# Patient Record
Sex: Male | Born: 1985 | Race: White | Hispanic: No | Marital: Single | Smoking: Current every day smoker
Health system: Southern US, Community
[De-identification: ages and names within clinical notes are randomized; demographics above are authoritative.]

## PROBLEM LIST (undated history)

## (undated) DIAGNOSIS — M1990 Unspecified osteoarthritis, unspecified site: Secondary | ICD-10-CM

---

## 2006-08-11 ENCOUNTER — Emergency Department: Payer: Self-pay | Admitting: Emergency Medicine

## 2008-12-21 ENCOUNTER — Ambulatory Visit: Payer: Self-pay | Admitting: Internal Medicine

## 2010-06-23 ENCOUNTER — Emergency Department (HOSPITAL_COMMUNITY)
Admission: EM | Admit: 2010-06-23 | Discharge: 2010-06-23 | Payer: Self-pay | Source: Home / Self Care | Admitting: Emergency Medicine

## 2016-12-07 ENCOUNTER — Emergency Department
Admission: EM | Admit: 2016-12-07 | Discharge: 2016-12-07 | Disposition: A | Discharge status: Skilled Nursing Facility | Payer: Self-pay | Attending: Emergency Medicine | Admitting: Emergency Medicine

## 2016-12-07 DIAGNOSIS — G4700 Insomnia, unspecified: Secondary | ICD-10-CM | POA: Insufficient documentation

## 2016-12-07 DIAGNOSIS — E876 Hypokalemia: Secondary | ICD-10-CM | POA: Insufficient documentation

## 2016-12-07 DIAGNOSIS — F1099 Alcohol use, unspecified with unspecified alcohol-induced disorder: Secondary | ICD-10-CM | POA: Insufficient documentation

## 2016-12-07 DIAGNOSIS — F17200 Nicotine dependence, unspecified, uncomplicated: Secondary | ICD-10-CM | POA: Insufficient documentation

## 2016-12-07 DIAGNOSIS — R531 Weakness: Secondary | ICD-10-CM | POA: Insufficient documentation

## 2016-12-07 HISTORY — DX: Unspecified osteoarthritis, unspecified site: M19.90

## 2016-12-07 LAB — BASIC METABOLIC PANEL
ANION GAP: 14 (ref 5–15)
ANION GAP: 10 (ref 5–15)
BUN: 13 mg/dL (ref 6–20)
BUN: 12 mg/dL (ref 6–20)
CHLORIDE: 99 mmol/L — AB (ref 101–111)
CO2: 27 mmol/L (ref 22–32)
CO2: 28 mmol/L (ref 22–32)
Calcium: 9.3 mg/dL (ref 8.9–10.3)
Calcium: 8.8 mg/dL — ABNORMAL LOW (ref 8.9–10.3)
Chloride: 96 mmol/L — ABNORMAL LOW (ref 101–111)
Creatinine, Ser: 0.7 mg/dL (ref 0.61–1.24)
Creatinine, Ser: 0.76 mg/dL (ref 0.61–1.24)
GFR calc Af Amer: >60 mL/min (ref >60)
GFR calc non Af Amer: >60 mL/min (ref >60)
GLUCOSE: 109 mg/dL — AB (ref 65–99)
Glucose, Bld: 106 mg/dL — ABNORMAL HIGH (ref 65–99)
POTASSIUM: 2.2 mmol/L — AB (ref 3.5–5.1)
POTASSIUM: 2.9 mmol/L — AB (ref 3.5–5.1)
SODIUM: 137 mmol/L (ref 135–145)
SODIUM: 137 mmol/L (ref 135–145)

## 2016-12-07 LAB — POTASSIUM: Potassium: 2.5 mmol/L — CL (ref 3.5–5.1)

## 2016-12-07 LAB — CBC
HEMATOCRIT: 49.5 % (ref 40.0–52.0)
HEMOGLOBIN: 17.5 g/dL (ref 13.0–18.0)
MCH: 36.2 pg — ABNORMAL HIGH (ref 26.0–34.0)
MCHC: 35.3 g/dL (ref 32.0–36.0)
MCV: 102.5 fL — ABNORMAL HIGH (ref 80.0–100.0)
Platelets: 251 10*3/uL (ref 150–440)
RBC: 4.83 MIL/uL (ref 4.40–5.90)
RDW: 13.4 % (ref 11.5–14.5)
WBC: 7.5 10*3/uL (ref 3.8–10.6)

## 2016-12-07 LAB — MAGNESIUM: MAGNESIUM: 1.5 mg/dL — AB (ref 1.7–2.4)

## 2016-12-07 MED ORDER — NICOTINE 14 MG/24HR TD PT24
14.0000 mg | MEDICATED_PATCH | Freq: Once | TRANSDERMAL | Status: DC
  Administered 2016-12-07: 14 mg via TRANSDERMAL
  Filled 2016-12-07: qty 1

## 2016-12-07 MED ORDER — MAGNESIUM SULFATE IN D5W 1-5 GM/100ML-% IV SOLN
1.0000 g | Freq: Once | INTRAVENOUS | Status: AC
  Administered 2016-12-07: 1 g via INTRAVENOUS
  Filled 2016-12-07: qty 100

## 2016-12-07 MED ORDER — POTASSIUM CHLORIDE IN NACL 20-0.9 MEQ/L-% IV SOLN
Freq: Once | INTRAVENOUS | Status: AC
  Administered 2016-12-07: 13:00:00 via INTRAVENOUS
  Filled 2016-12-07: qty 1000

## 2016-12-07 MED ORDER — POTASSIUM CHLORIDE ER 10 MEQ PO TBCR: 20.0000 meq | EXTENDED_RELEASE_TABLET | Freq: Every day | ORAL | 0 refills | Status: AC

## 2016-12-07 MED ORDER — ZOLPIDEM TARTRATE 10 MG PO TABS: 10.0000 mg | ORAL_TABLET | Freq: Every evening | ORAL | 0 refills | Status: AC | PRN

## 2016-12-07 MED ORDER — POTASSIUM CHLORIDE CRYS ER 20 MEQ PO TBCR
60.0000 meq | EXTENDED_RELEASE_TABLET | Freq: Once | ORAL | Status: AC
  Administered 2016-12-07: 60 meq via ORAL
  Filled 2016-12-07: qty 3

## 2016-12-07 NOTE — ED Notes (Signed)
First Nurse: pt presents with shortness of breath.

## 2016-12-07 NOTE — ED Provider Notes (Signed)
-----------------------------------------   6:49 PM on 12/07/2016 -----------------------------------------  Patient's potassium has. Patient has been able to eat here. Patient still would rather not be admitted to the hospital. He was given some magnesium through IV. Patient states he feels much improved.At this point will plan on discharging with prescriptions prepared by Dr. Mayford KnifeWilliams. Will give patient information on potassium content of foods. Will give patient primary care follow-up.   Phineas SemenGoodman, Symantha Steeber, MD 12/07/16 [phone]

## 2016-12-07 NOTE — ED Notes (Signed)
ED Provider at bedside. 

## 2016-12-07 NOTE — ED Provider Notes (Addendum)
[facility] Emergency Department Provider Note       Time seen: ----------------------------------------- 1:29 PM on 12/07/2016 -----------------------------------------     I have reviewed the triage vital signs and the nursing notes.   HISTORY   Chief Complaint Weakness    HPI Kyle Mora is a 31 y.o. male who presents to the ED for weakness for the past several days and vomiting that began this morning. Patient states "I can barely lift my arm". He has had some sweatiness, he denies fevers, chills, chest pain, shortness of breath, diarrhea or fever. He did have an episode of vomiting but there was no hematemesis. Patient states he has been drinking alcohol to try to help him sleep. He drinks about 12 ounces of alcohol every night. He reports he is gone almost 2 weeks without significant sleep.   Past Medical History:  Diagnosis Date  . Arthritis     There are no active problems to display for this patient.   History reviewed. No pertinent surgical history.  Allergies Patient has no known allergies.  Social History Social History  Substance Use Topics  . Smoking status: Current Every Day Smoker  . Smokeless tobacco: Not on file  . Alcohol use Yes    Review of Systems Constitutional: Negative for fever. Eyes: Negative for vision changes ENT:  Negative for congestion, sore throat Cardiovascular: Negative for chest pain. Respiratory: Negative for shortness of breath. Gastrointestinal: Negative for abdominal pain,Positive for vomiting Genitourinary: Negative for dysuria. Musculoskeletal: Negative for back pain. Skin: Negative for rash. Neurological: Negative for headaches,Positive for generalized weakness  All systems negative/normal/unremarkable except as stated in the HPI  ____________________________________________   PHYSICAL EXAM:  VITAL SIGNS: ED Triage Vitals  Enc Vitals Group     BP 12/07/16 1233 130/80   Pulse Rate 12/07/16 1233 100     Resp 12/07/16 1233 18     Temp 12/07/16 1233 98.2 F (36.8 C)     Temp Source 12/07/16 1233 Oral     SpO2 12/07/16 1233 99 %     Weight 12/07/16 1233 170 lb (77.1 kg)     Height 12/07/16 1233 5\' 8"  (1.727 m)     Head Circumference --      Peak Flow --      Pain Score 12/07/16 1235 0     Pain Loc --      Pain Edu? --      Excl. in GC? --     Constitutional: Alert and oriented. Well appearing and in no distress. Eyes: Conjunctivae are normal. Normal extraocular movements. ENT   Head: Normocephalic and atraumatic.   Nose: No congestion/rhinnorhea.   Mouth/Throat: Mucous membranes are moist.   Neck: No stridor. Cardiovascular: Normal rate, regular rhythm. No murmurs, rubs, or gallops. Respiratory: Normal respiratory effort without tachypnea nor retractions. Breath sounds are clear and equal bilaterally. No wheezes/rales/rhonchi. Gastrointestinal: Soft and nontender. Normal bowel sounds Musculoskeletal: Nontender with normal range of motion in extremities. No lower extremity tenderness nor edema. Neurologic:  Normal speech and language. No gross focal neurologic deficits are appreciated.  Skin:  Skin is warm, dry and intact. No rash noted. Psychiatric: Mood and affect are normal. Speech and behavior are normal.  ____________________________________________  EKG: Interpreted by me. Sinus tachycardia with rate 104 bpm, normal PR interval, normal QRS, normal QT, inferior ST segment abnormalities with possible U wave  ____________________________________________  ED COURSE:  Pertinent labs & imaging results that were available during my care of the  patient were reviewed by me and considered in my medical decision making (see chart for details). Patient presents for weakness, we will assess with labs as indicated.   Procedures ____________________________________________   LABS (pertinent positives/negatives)  Labs Reviewed  BASIC  METABOLIC PANEL - Abnormal; Notable for the following:       Result Value   Potassium 2.2 (*)    Chloride 96 (*)    Glucose, Bld 109 (*)    All other components within normal limits  CBC - Abnormal; Notable for the following:    MCV 102.5 (*)    MCH 36.2 (*)    All other components within normal limits  URINALYSIS, COMPLETE (UACMP) WITH MICROSCOPIC  POTASSIUM  CBG MONITORING, ED   ____________________________________________  FINAL ASSESSMENT AND PLAN  Weakness, hypokalemia, insomnia, alcohol use disorder  Plan: Patient's labs and imaging were dictated above. Patient had presented for weakness and was found to have significantly low potassium with a level of 2.2. He was given oral and IV potassium.   Emily Filbert, MD   Note: This note was generated in part or whole with voice recognition software. Voice recognition is usually quite accurate but there are transcription errors that can and very often do occur. I apologize for any typographical errors that were not detected and corrected.     Emily Filbert, MD 12/07/16 1331    Emily Filbert, MD 12/07/16 [phone]

## 2016-12-07 NOTE — ED Notes (Addendum)
Pt c/o weakness xfew days. Pt states increased stress and decreased sleep, hx of insomnia. Pt has notable tremors, A&OX4.  Pt states CP earlier today but denies any at this time. Pt admits to drinking daily, denies any ETOH today.

## 2016-12-07 NOTE — ED Provider Notes (Signed)
Repeat potassium is 2.5. Patient would prefer to stay out of the hospital. We will give oral and IV potassium and recheck.   Emily FilbertWilliams, Jonathan E, MD 12/07/16 [phone]

## 2016-12-07 NOTE — ED Notes (Signed)
Date and time results received: 12/07/16 1:45 PM   Test: Potassium Critical Value: 2.5  Name of Provider Notified: Dr. Mayford KnifeWilliams  Orders Received? Or Actions Taken?: Actions Taken: Dr. Mayford KnifeWilliams notified and acknowledged

## 2016-12-07 NOTE — ED Triage Notes (Signed)
Lab notifies K 2.2, patient roomed.

## 2016-12-07 NOTE — ED Triage Notes (Signed)
Pt presents to ED for weakness and vomiting that began this morning. "I can barely lift my arm." pt states sweatiness. Denies CP, SOB, diarrhea, fevers. States 1 episode of emesis, denies blood in it.

## 2016-12-07 NOTE — ED Notes (Signed)
Pt stated he felt like he was going to pass out after blood draw. Informed slow deep breaths. Pt asked to lay down. Pt moved to recliner and feet propped up.

## 2016-12-07 NOTE — Discharge Instructions (Signed)
Please seek medical attention for any high fevers, chest pain, shortness of breath, change in behavior, persistent vomiting, bloody stool or any other new or concerning symptoms.  

## 2017-03-04 ENCOUNTER — Encounter: Payer: Self-pay | Admitting: Emergency Medicine

## 2017-03-04 ENCOUNTER — Emergency Department: Payer: Self-pay

## 2017-03-04 ENCOUNTER — Emergency Department
Admission: EM | Admit: 2017-03-04 | Discharge: 2017-03-04 | Disposition: A | Discharge status: Home / Self Care | Payer: Self-pay | Attending: Student in an Organized Health Care Education/Training Program | Admitting: Student in an Organized Health Care Education/Training Program

## 2017-03-04 DIAGNOSIS — R51 Headache: Secondary | ICD-10-CM | POA: Insufficient documentation

## 2017-03-04 DIAGNOSIS — J189 Pneumonia, unspecified organism: Secondary | ICD-10-CM

## 2017-03-04 DIAGNOSIS — J181 Lobar pneumonia, unspecified organism: Secondary | ICD-10-CM | POA: Insufficient documentation

## 2017-03-04 DIAGNOSIS — R0981 Nasal congestion: Secondary | ICD-10-CM | POA: Insufficient documentation

## 2017-03-04 DIAGNOSIS — F17200 Nicotine dependence, unspecified, uncomplicated: Secondary | ICD-10-CM | POA: Insufficient documentation

## 2017-03-04 DIAGNOSIS — R509 Fever, unspecified: Secondary | ICD-10-CM | POA: Insufficient documentation

## 2017-03-04 DIAGNOSIS — M25551 Pain in right hip: Secondary | ICD-10-CM | POA: Insufficient documentation

## 2017-03-04 DIAGNOSIS — Z79899 Other long term (current) drug therapy: Secondary | ICD-10-CM | POA: Insufficient documentation

## 2017-03-04 LAB — URINALYSIS, COMPLETE (UACMP) WITH MICROSCOPIC
BILIRUBIN URINE: NEGATIVE
Bacteria, UA: NONE SEEN
Glucose, UA: NEGATIVE mg/dL
Ketones, ur: NEGATIVE mg/dL
NITRITE: NEGATIVE
PH: 6 (ref 5.0–8.0)
Protein, ur: 100 mg/dL — AB
SPECIFIC GRAVITY, URINE: 1.02 (ref 1.005–1.030)

## 2017-03-04 LAB — COMPREHENSIVE METABOLIC PANEL
ALBUMIN: 3.8 g/dL (ref 3.5–5.0)
ALK PHOS: 61 U/L (ref 38–126)
ALT: 29 U/L (ref 17–63)
ANION GAP: 16 — AB (ref 5–15)
AST: 39 U/L (ref 15–41)
BILIRUBIN TOTAL: 1.1 mg/dL (ref 0.3–1.2)
BUN: 10 mg/dL (ref 6–20)
CALCIUM: 8.5 mg/dL — AB (ref 8.9–10.3)
CO2: 28 mmol/L (ref 22–32)
Chloride: 89 mmol/L — ABNORMAL LOW (ref 101–111)
Creatinine, Ser: 0.96 mg/dL (ref 0.61–1.24)
GLUCOSE: 148 mg/dL — AB (ref 65–99)
POTASSIUM: 2.7 mmol/L — AB (ref 3.5–5.1)
Sodium: 133 mmol/L — ABNORMAL LOW (ref 135–145)
TOTAL PROTEIN: 8.4 g/dL — AB (ref 6.5–8.1)

## 2017-03-04 LAB — CBC
HEMATOCRIT: 44.7 % (ref 40.0–52.0)
Hemoglobin: 16.1 g/dL (ref 13.0–18.0)
MCH: 36.4 pg — AB (ref 26.0–34.0)
MCHC: 36.1 g/dL — AB (ref 32.0–36.0)
MCV: 100.9 fL — ABNORMAL HIGH (ref 80.0–100.0)
Platelets: 222 10*3/uL (ref 150–440)
RBC: 4.43 MIL/uL (ref 4.40–5.90)
RDW: 13.6 % (ref 11.5–14.5)
WBC: 12.3 10*3/uL — ABNORMAL HIGH (ref 3.8–10.6)

## 2017-03-04 MED ORDER — DOXYCYCLINE HYCLATE 100 MG PO TABS
100.0000 mg | ORAL_TABLET | Freq: Once | ORAL | Status: AC
  Administered 2017-03-04: 100 mg via ORAL
  Filled 2017-03-04: qty 1

## 2017-03-04 MED ORDER — DOXYCYCLINE HYCLATE 50 MG PO CAPS: 100.0000 mg | ORAL_CAPSULE | Freq: Two times a day (BID) | ORAL | 0 refills | Status: AC

## 2017-03-04 MED ORDER — NAPROXEN 500 MG PO TABS: 500.0000 mg | ORAL_TABLET | Freq: Two times a day (BID) | ORAL | 0 refills | Status: AC

## 2017-03-04 MED ORDER — IBUPROFEN 600 MG PO TABS
600.0000 mg | ORAL_TABLET | Freq: Once | ORAL | Status: AC
  Administered 2017-03-04: 600 mg via ORAL
  Filled 2017-03-04: qty 1

## 2017-03-04 MED ORDER — POTASSIUM CHLORIDE CRYS ER 20 MEQ PO TBCR
40.0000 meq | EXTENDED_RELEASE_TABLET | Freq: Once | ORAL | Status: AC
  Administered 2017-03-04: 40 meq via ORAL
  Filled 2017-03-04: qty 2

## 2017-03-04 MED ORDER — DOXYCYCLINE HYCLATE 100 MG PO TABS: 100.0000 mg | ORAL_TABLET | Freq: Two times a day (BID) | ORAL | 0 refills | Status: AC

## 2017-03-04 MED ORDER — SODIUM CHLORIDE 0.9 % IV BOLUS (SEPSIS)
1000.0000 mL | Freq: Once | INTRAVENOUS | Status: AC
  Administered 2017-03-04: 1000 mL via INTRAVENOUS

## 2017-03-04 NOTE — ED Triage Notes (Signed)
Pt to ed with c/o right hip pain, cough, congestion, fever, x 4 days. [facility] for [facility], last IBU  at 0530 today for hip pain.  Negative mono, negative strep, negative flu at [facility].

## 2017-03-04 NOTE — ED Provider Notes (Signed)
[facility] Emergency Department Provider Note    First MD Initiated Contact with Patient 03/04/17 1224     (approximate)  I have reviewed the triage vital signs and the nursing notes.   HISTORY  Chief Complaint Cough; Nasal Congestion; Fever; and Headache    HPI Kyle Mora is a 31 y.o. male presents with chief complaint of initially nasal congestion followed by productive cough and fevers for the past 4 days. Patient's also complaining of right hip pain. States he's been having issues with his right hip for years. States that is typically worse after working a long shift. Has not had any surgeries previously. Denies any back pain. No new numbness or tingling. No saddle anesthesia. No urinary incontinence. No dysuria. No recent sick contacts.   Past Medical History:  Diagnosis Date  . Arthritis    History reviewed. No pertinent family history. History reviewed. No pertinent surgical history. There are no active problems to display for this patient.     Prior to Admission medications   Medication Sig Start Date End Date Taking? Authorizing Provider  amitriptyline (ELAVIL) 25 MG tablet Take 50 mg by mouth at bedtime. 02/22/17  Yes [provider]  ibuprofen (ADVIL,MOTRIN) 200 MG tablet Take 200 mg by mouth every 6 (six) hours as needed.   Yes [provider]  doxycycline (VIBRAMYCIN) 50 MG capsule Take 2 capsules (100 mg total) by mouth 2 (two) times daily. 03/04/17 03/14/17  Willy Eddy, MD  naproxen (NAPROSYN) 500 MG tablet Take 1 tablet (500 mg total) by mouth 2 (two) times daily with a meal. 03/04/17 03/04/18  Willy Eddy, MD  potassium chloride (K-DUR) 10 MEQ tablet Take 2 tablets (20 mEq total) by mouth daily. Patient not taking: Reported on 03/04/2017 12/07/16   Emily Filbert, MD  zolpidem (AMBIEN) 10 MG tablet Take 1 tablet (10 mg total) by mouth at bedtime as needed for sleep. Patient not taking: Reported  on 03/04/2017 12/07/16   Emily Filbert, MD    Allergies Patient has no known allergies.    Social History Social History  Substance Use Topics  . Smoking status: Current Every Day Smoker  . Smokeless tobacco: Never Used  . Alcohol use Yes    Review of Systems Patient denies headaches, rhinorrhea, blurry vision, numbness, shortness of breath, chest pain, edema, cough, abdominal pain, nausea, vomiting, diarrhea, dysuria, fevers, rashes or hallucinations unless otherwise stated above in HPI. ____________________________________________   PHYSICAL EXAM:  VITAL SIGNS: Vitals:   03/04/17 1129  BP: (!) 141/88  Pulse: (!) 138  Resp: 18  Temp: 99.7 F (37.6 C)  SpO2: 97%    Constitutional: Alert and oriented. in no acute distress. Eyes: Conjunctivae are normal.  Head: Atraumatic. Nose: No congestion/rhinnorhea. Mouth/Throat: Mucous membranes are moist.   Neck: No stridor. Painless ROM.  Cardiovascular: Normal rate, regular rhythm. Grossly normal heart sounds.  Good peripheral circulation. Respiratory: Normal respiratory effort.  No retractions. Lungs CTAB. Gastrointestinal: Soft and nontender. No distention. No abdominal bruits. No CVA tenderness.  No RLQ ttp Musculoskeletal: No lower extremity tenderness nor edema.  No joint effusions.  No pain with full active and passive flexion and extension of right hip.  No pain with deep palpation or log roll of right hip.  No pain elicited with internal or external rotation of the hip  No erythema or pulsatile mass. Neurologic:  Normal speech and language. No gross focal neurologic deficits are appreciated. No facial droop Skin:  Skin  is warm, dry and intact. No rash noted. Psychiatric: Mood and affect are normal. Speech and behavior are normal.  ____________________________________________   LABS (all labs ordered are listed, but only abnormal results are displayed)  Results for orders placed or performed during the hospital  encounter of 03/04/17 (from the past 24 hour(s))  CBC     Status: Abnormal   Collection Time: 03/04/17 11:36 AM  Result Value Ref Range   WBC 12.3 (H) 3.8 - 10.6 K/uL   RBC 4.43 4.40 - 5.90 MIL/uL   Hemoglobin 16.1 13.0 - 18.0 g/dL   HCT 16.1 09.6 - 04.5 %   MCV 100.9 (H) 80.0 - 100.0 fL   MCH 36.4 (H) 26.0 - 34.0 pg   MCHC 36.1 (H) 32.0 - 36.0 g/dL   RDW 40.9 81.1 - 91.4 %   Platelets 222 150 - 440 K/uL  Comprehensive metabolic panel     Status: Abnormal   Collection Time: 03/04/17 11:36 AM  Result Value Ref Range   Sodium 133 (L) 135 - 145 mmol/L   Potassium 2.7 (LL) 3.5 - 5.1 mmol/L   Chloride 89 (L) 101 - 111 mmol/L   CO2 28 22 - 32 mmol/L   Glucose, Bld 148 (H) 65 - 99 mg/dL   BUN 10 6 - 20 mg/dL   Creatinine, Ser 7.82 0.61 - 1.24 mg/dL   Calcium 8.5 (L) 8.9 - 10.3 mg/dL   Total Protein 8.4 (H) 6.5 - 8.1 g/dL   Albumin 3.8 3.5 - 5.0 g/dL   AST 39 15 - 41 U/L   ALT 29 17 - 63 U/L   Alkaline Phosphatase 61 38 - 126 U/L   Total Bilirubin 1.1 0.3 - 1.2 mg/dL   GFR calc non Af Amer >60 >60 mL/min   GFR calc Af Amer >60 >60 mL/min   Anion gap 16 (H) 5 - 15  Urinalysis, Complete w Microscopic     Status: Abnormal   Collection Time: 03/04/17  2:59 PM  Result Value Ref Range   Color, Urine AMBER (A) YELLOW   APPearance HAZY (A) CLEAR   Specific Gravity, Urine 1.020 1.005 - 1.030   pH 6.0 5.0 - 8.0   Glucose, UA NEGATIVE NEGATIVE mg/dL   Hgb urine dipstick SMALL (A) NEGATIVE   Bilirubin Urine NEGATIVE NEGATIVE   Ketones, ur NEGATIVE NEGATIVE mg/dL   Protein, ur 956 (A) NEGATIVE mg/dL   Nitrite NEGATIVE NEGATIVE   Leukocytes, UA TRACE (A) NEGATIVE   RBC / HPF 0-5 0 - 5 RBC/hpf   WBC, UA 6-30 0 - 5 WBC/hpf   Bacteria, UA NONE SEEN NONE SEEN   Squamous Epithelial / LPF 0-5 (A) NONE SEEN   Mucus PRESENT    Non Squamous Epithelial 0-5 (A) NONE SEEN   ____________________________________________ ____________________________________________  RADIOLOGY  I personally  reviewed all radiographic images ordered to evaluate for the above acute complaints and reviewed radiology reports and findings.  These findings were personally discussed with the patient.  Please see medical record for radiology report.  ____________________________________________   PROCEDURES  Procedure(s) performed:  Procedures    Critical Care performed: no ____________________________________________   INITIAL IMPRESSION / ASSESSMENT AND PLAN / ED COURSE  Pertinent labs & imaging results that were available during my care of the patient were reviewed by me and considered in my medical decision making (see chart for details).  DDX: flu, pna, uri, arthritis, fracture, septic arthritis, abscess  REIS GOGA is a 31 y.o. who presents to  the ED with symptoms as described above. Patient is with low-grade temperature here tachycardic but with no respiratory distress.  physical exam is soft and benign. This is not clinically consistent with appendicitis. There is no objective pain or reproducible discomfort on examination of the right hip or lower extremity. This is not clinically consistent with septic arthritis. Chest x-ray does show evidence of infiltrate in the right lower lobe and given his productive cough fever and tachycardia will treat him for community-acquired pneumonia. He is otherwise very well-appearing. He has no risk factors or clinical signs or symptoms suggest epidural abscess or more insidious process. Patient will receive IV fluids  Clinical Course as of Mar 04 1532  Thu Mar 04, 2017  1530 patient reassessed him to ambulate with pain. This is not clinically consistent with septic arthritis. Patient tolerated doxycycline. Does have mild tachycardia but is improving with IV fluids and patient states that he has a lot of anxiety about being in the hospital with frequent dressings his heart rate up. He is tolerating oral hydration at this point he really that he is  stable and appropriate for follow-up.  [PR]    Clinical Course User Index [PR] Willy Eddy, MD     ____________________________________________   FINAL CLINICAL IMPRESSION(S) / ED DIAGNOSES  Final diagnoses:  Community acquired pneumonia of right lower lobe of lung (HCC)  Hip pain, acute, right      NEW MEDICATIONS STARTED DURING THIS VISIT:  New Prescriptions   DOXYCYCLINE (VIBRAMYCIN) 50 MG CAPSULE    Take 2 capsules (100 mg total) by mouth 2 (two) times daily.   NAPROXEN (NAPROSYN) 500 MG TABLET    Take 1 tablet (500 mg total) by mouth 2 (two) times daily with a meal.     Note:  This document was prepared using Dragon voice recognition software and may include unintentional dictation errors.    Willy Eddy, MD 03/04/17 1534

## 2019-03-30 ENCOUNTER — Other Ambulatory Visit: Payer: Self-pay

## 2019-03-30 ENCOUNTER — Encounter: Payer: Self-pay | Admitting: Emergency Medicine

## 2019-03-30 DIAGNOSIS — Y929 Unspecified place or not applicable: Secondary | ICD-10-CM | POA: Insufficient documentation

## 2019-03-30 DIAGNOSIS — Y939 Activity, unspecified: Secondary | ICD-10-CM | POA: Diagnosis not present

## 2019-03-30 DIAGNOSIS — S0990XA Unspecified injury of head, initial encounter: Secondary | ICD-10-CM | POA: Diagnosis present

## 2019-03-30 DIAGNOSIS — F17210 Nicotine dependence, cigarettes, uncomplicated: Secondary | ICD-10-CM | POA: Diagnosis not present

## 2019-03-30 DIAGNOSIS — Z79899 Other long term (current) drug therapy: Secondary | ICD-10-CM | POA: Insufficient documentation

## 2019-03-30 DIAGNOSIS — Y999 Unspecified external cause status: Secondary | ICD-10-CM | POA: Diagnosis not present

## 2019-03-30 DIAGNOSIS — W228XXA Striking against or struck by other objects, initial encounter: Secondary | ICD-10-CM | POA: Insufficient documentation

## 2019-03-30 DIAGNOSIS — S0083XA Contusion of other part of head, initial encounter: Secondary | ICD-10-CM | POA: Diagnosis not present

## 2019-03-30 NOTE — ED Triage Notes (Signed)
Patient ambulatory to triage with steady gait, without difficulty or distress noted, mask in place; pt reports on Tuesday while working on a car, the hood fell hitting the left side of his face; c/o pain to left cheek/jaw since; denies LOC

## 2019-03-31 ENCOUNTER — Emergency Department: Payer: No Typology Code available for payment source

## 2019-03-31 ENCOUNTER — Emergency Department
Admission: EM | Admit: 2019-03-31 | Discharge: 2019-03-31 | Disposition: A | Discharge status: Home / Self Care | Payer: No Typology Code available for payment source | Attending: Emergency Medicine | Admitting: Emergency Medicine

## 2019-03-31 DIAGNOSIS — S0083XA Contusion of other part of head, initial encounter: Secondary | ICD-10-CM

## 2019-03-31 MED ORDER — KETOROLAC TROMETHAMINE 10 MG PO TABS: 10.0000 mg | ORAL_TABLET | Freq: Four times a day (QID) | ORAL | 0 refills | Status: AC | PRN

## 2019-03-31 MED ORDER — KETOROLAC TROMETHAMINE 10 MG PO TABS
10.0000 mg | ORAL_TABLET | Freq: Once | ORAL | Status: AC
  Administered 2019-03-31: 10 mg via ORAL
  Filled 2019-03-31: qty 1

## 2019-03-31 MED ORDER — AMOXICILLIN-POT CLAVULANATE 875-125 MG PO TABS: 1.0000 | ORAL_TABLET | Freq: Two times a day (BID) | ORAL | 0 refills | Status: AC

## 2019-03-31 MED ORDER — AMOXICILLIN-POT CLAVULANATE 875-125 MG PO TABS: 1.0000 | ORAL_TABLET | Freq: Two times a day (BID) | ORAL | 0 refills | Status: DC

## 2019-03-31 MED ORDER — KETOROLAC TROMETHAMINE 10 MG PO TABS: 10.0000 mg | ORAL_TABLET | Freq: Four times a day (QID) | ORAL | 0 refills | Status: DC | PRN

## 2019-03-31 NOTE — ED Provider Notes (Signed)
[facility] Emergency Department Provider Note    First MD Initiated Contact with Patient 03/31/19 [phone]     (approximate)  I have reviewed the triage vital signs and the nursing notes.   HISTORY  Chief Complaint Facial Injury   HPI Kyle Mora is a 33 y.o. male presents to the emergency department with history of being struck on the head/left side of his face by the hood of a car 3 days ago while working on the vehicle.  Patient states that the hydraulics for the hood "gave out resulting in the hood of the car falling onto his head/left side of his face.  Patient denies any loss of consciousness.  Patient does admit however to left facial pain and swelling since the event.  She denies any nausea or vomiting.  Patient does admit to very poor dentition and states that in the past when he is taking a blow to the face he subsequently developed a dental infection.        Past Medical History:  Diagnosis Date   Arthritis     There are no active problems to display for this patient.   History reviewed. No pertinent surgical history.  Prior to Admission medications   Medication Sig Start Date End Date Taking? Authorizing Provider  amitriptyline (ELAVIL) 25 MG tablet Take 50 mg by mouth at bedtime. 02/22/17   [provider]  amoxicillin-clavulanate (AUGMENTIN) 875-125 MG tablet Take 1 tablet by mouth 2 (two) times daily for 10 days. 03/31/19 04/10/19  Darci Current, MD  ibuprofen (ADVIL,MOTRIN) 200 MG tablet Take 200 mg by mouth every 6 (six) hours as needed.    [provider]  ketorolac (TORADOL) 10 MG tablet Take 1 tablet (10 mg total) by mouth every 6 (six) hours as needed. 03/31/19   Darci Current, MD  potassium chloride (K-DUR) 10 MEQ tablet Take 2 tablets (20 mEq total) by mouth daily. Patient not taking: Reported on 03/04/2017 12/07/16   Emily Filbert, MD  zolpidem (AMBIEN) 10 MG tablet Take 1 tablet (10 mg total)  by mouth at bedtime as needed for sleep. Patient not taking: Reported on 03/04/2017 12/07/16   Emily Filbert, MD    Allergies Patient has no known allergies.  No family history on file.  Social History Social History   Tobacco Use   Smoking status: Current Every Day Smoker   Smokeless tobacco: Never Used  Substance Use Topics   Alcohol use: Yes   Drug use: No    Review of Systems Constitutional: No fever/chills Eyes: No visual changes. ENT: No sore throat.  Positive for left facial swelling Cardiovascular: Denies chest pain. Respiratory: Denies shortness of breath. Gastrointestinal: No abdominal pain.  No nausea, no vomiting.  No diarrhea.  No constipation. Genitourinary: Negative for dysuria. Musculoskeletal: Negative for neck pain.  Negative for back pain. Integumentary: Negative for rash. Neurological: Negative for headaches, focal weakness or numbness.  ____________________________________________   PHYSICAL EXAM:  VITAL SIGNS: ED Triage Vitals  Enc Vitals Group     BP 03/30/19 2349 136/87     Pulse Rate 03/30/19 2349 (!) 102     Resp 03/30/19 2349 18     Temp 03/30/19 2349 98.1 F (36.7 C)     Temp Source 03/30/19 2349 Oral     SpO2 03/30/19 2349 96 %     Weight 03/30/19 2350 72.6 kg (160 lb)     Height 03/30/19 2350 1.702 m (5\' 7" )  Head Circumference --      Peak Flow --      Pain Score 03/30/19 2350 8     Pain Loc --      Pain Edu? --      Excl. in GC? --     Constitutional: Alert and oriented.  Eyes: Conjunctivae are normal.  Head: Positive for left facial swelling Mouth/Throat: Mucous membranes are moist. Neck: No stridor.  No meningeal signs.   Cardiovascular: Normal rate, regular rhythm. Good peripheral circulation. Grossly normal heart sounds. Respiratory: Normal respiratory effort.  No retractions. Gastrointestinal: Soft and nontender. No distention.  Musculoskeletal: No lower extremity tenderness nor edema. No gross  deformities of extremities. Neurologic:  Normal speech and language. No gross focal neurologic deficits are appreciated.  Skin:  Skin is warm, dry and intact. Psychiatric: Mood and affect are normal. Speech and behavior are normal.  ________________________________ RADIOLOGY I, [place] Nealie [place], personally viewed and evaluated these images (plain radiographs) as part of my medical decision making, as well as reviewing the written report by the radiologist.  ED MD interpretation: CT head revealed no acute intracranial abnormality CT maxillofacial revealed left infraorbital malar swelling without subcutaneous gas or hematoma.  Extensive periodontal disease per radiologist.  Official radiology report(s): Ct Head Wo Contrast  Result Date: 03/31/2019 CLINICAL DATA:  Facial trauma, fracture suspected. Struck by hood of car while working on vehicle. Left-sided facial pain. EXAM: CT HEAD WITHOUT CONTRAST CT MAXILLOFACIAL WITHOUT CONTRAST TECHNIQUE: Multidetector CT imaging of the head and maxillofacial structures were performed using the standard protocol without intravenous contrast. Multiplanar CT image reconstructions of the maxillofacial structures were also generated. COMPARISON:  None. FINDINGS: CT HEAD FINDINGS Brain: No evidence of acute infarction, hemorrhage, hydrocephalus, extra-axial collection or mass lesion/mass effect. Vascular: No hyperdense vessel or unexpected calcification. Skull: No scalp swelling or hematoma. No calvarial fracture. Other: None. CT MAXILLOFACIAL FINDINGS Osseous: No fracture of the bony orbits. Nasal bones are intact. No mid face fractures are seen. The pterygoid plates are intact. The mandible is intact. Temporomandibular joints are normally aligned. No temporal bone fractures are identified. No fractured or avulsed teeth. Extensive periodontal disease with numerous carious lesions and periapical lucencies. Partially erupted right maxillary cuspid with orthodontic  hardware. Orbits: The globes appear normal and symmetric. Symmetric appearance of the extraocular musculature and optic nerve sheath complexes. Normal caliber of the superior ophthalmic veins. Sinuses: Mild pansinus mural disease. No air-fluid levels. Mastoid air cells are well aerated. Middle ear cavities are clear. Debris within both external auditory canals. Ossicular chains are normal configuration. Soft tissues: Mild asymmetric left infraorbital and malar swelling without subcutaneous gas or hematoma. No radiopaque foreign bodies. IMPRESSION: 1. No acute intracranial abnormality. 2. Mild asymmetric left infraorbital and malar swelling without subcutaneous gas or hematoma. No acute facial or calvarial fracture. 3. Extensive periodontal disease. Electronically Signed   By: Kreg ShropshirePrice  DeHay M.D.   On: 03/31/2019 00:32   Ct Maxillofacial Wo Contrast  Result Date: 03/31/2019 CLINICAL DATA:  Facial trauma, fracture suspected. Struck by hood of car while working on vehicle. Left-sided facial pain. EXAM: CT HEAD WITHOUT CONTRAST CT MAXILLOFACIAL WITHOUT CONTRAST TECHNIQUE: Multidetector CT imaging of the head and maxillofacial structures were performed using the standard protocol without intravenous contrast. Multiplanar CT image reconstructions of the maxillofacial structures were also generated. COMPARISON:  None. FINDINGS: CT HEAD FINDINGS Brain: No evidence of acute infarction, hemorrhage, hydrocephalus, extra-axial collection or mass lesion/mass effect. Vascular: No hyperdense vessel or unexpected calcification. Skull: No scalp  swelling or hematoma. No calvarial fracture. Other: None. CT MAXILLOFACIAL FINDINGS Osseous: No fracture of the bony orbits. Nasal bones are intact. No mid face fractures are seen. The pterygoid plates are intact. The mandible is intact. Temporomandibular joints are normally aligned. No temporal bone fractures are identified. No fractured or avulsed teeth. Extensive periodontal disease  with numerous carious lesions and periapical lucencies. Partially erupted right maxillary cuspid with orthodontic hardware. Orbits: The globes appear normal and symmetric. Symmetric appearance of the extraocular musculature and optic nerve sheath complexes. Normal caliber of the superior ophthalmic veins. Sinuses: Mild pansinus mural disease. No air-fluid levels. Mastoid air cells are well aerated. Middle ear cavities are clear. Debris within both external auditory canals. Ossicular chains are normal configuration. Soft tissues: Mild asymmetric left infraorbital and malar swelling without subcutaneous gas or hematoma. No radiopaque foreign bodies. IMPRESSION: 1. No acute intracranial abnormality. 2. Mild asymmetric left infraorbital and malar swelling without subcutaneous gas or hematoma. No acute facial or calvarial fracture. 3. Extensive periodontal disease. Electronically Signed   By: Lovena Le M.D.   On: 03/31/2019 00:32     Procedures   ____________________________________________   INITIAL IMPRESSION / MDM / ASSESSMENT AND PLAN / ED COURSE  As part of my medical decision making, I reviewed the following data within the electronic MEDICAL RECORD NUMBER   33 year old male presenting with above-stated history and physical exam secondary to left facial/head trauma.  CT scan of the head revealed no acute intracranial abnormality left infraorbital swelling noted and extensive peridental disease noted on CT.  Patient requests antibiotics at this time due to concern for possible dental infection.  ____________________________________________  FINAL CLINICAL IMPRESSION(S) / ED DIAGNOSES  Final diagnoses:  Contusion of face, initial encounter     MEDICATIONS GIVEN DURING THIS VISIT:  Medications  ketorolac (TORADOL) tablet 10 mg (has no administration in time range)     ED Discharge Orders         Ordered    amoxicillin-clavulanate (AUGMENTIN) 875-125 MG tablet  2 times daily      03/31/19 0319    ketorolac (TORADOL) 10 MG tablet  Every 6 hours PRN     03/31/19 0319          *Please note:  Kyle Mora was evaluated in Emergency Department on 03/31/2019 for the symptoms described in the history of present illness. He was evaluated in the context of the global COVID-19 pandemic, which necessitated consideration that the patient might be at risk for infection with the SARS-CoV-2 virus that causes COVID-19. Institutional protocols and algorithms that pertain to the evaluation of patients at risk for COVID-19 are in a state of rapid change based on information released by regulatory bodies including the CDC and federal and state organizations. These policies and algorithms were followed during the patient's care in the ED.  Some ED evaluations and interventions may be delayed as a result of limited staffing during the pandemic.*  Note:  This document was prepared using Dragon voice recognition software and may include unintentional dictation errors.   Gregor Hams, MD 03/31/19 [phone]

## 2019-03-31 NOTE — ED Notes (Signed)
ED Provider at bedside. 

## 2020-03-15 DEATH — deceased

## 2020-07-07 IMAGING — CT CT MAXILLOFACIAL W/O CM
3 series · 16 of 47 positions shown, 19 images · non-contrast
Comparison: None.

CLINICAL DATA: Facial trauma, fracture suspected. Struck by hood of
car while working on vehicle. Left-sided facial pain.

EXAM:
CT HEAD WITHOUT CONTRAST
CT MAXILLOFACIAL WITHOUT CONTRAST
TECHNIQUE: Multidetector CT imaging of the head and maxillofacial structures
were performed using the standard protocol without intravenous
contrast. Multiplanar CT image reconstructions of the maxillofacial
structures were also generated.

[Series 2: max soft · axial · 0.34mm/px · z∈[-228,-86]mm · 10 of 83 slices shown, 13 images]
[im 6/83  brain]
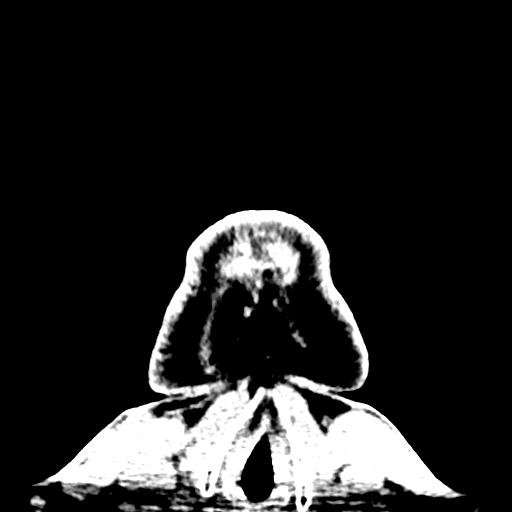
[im 6/83  bone]
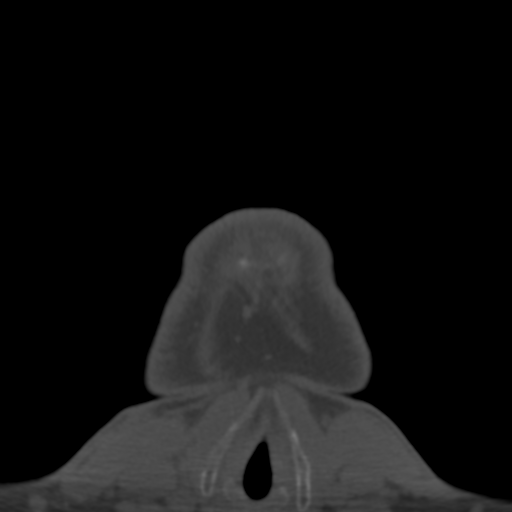
[im 15/83  bone]
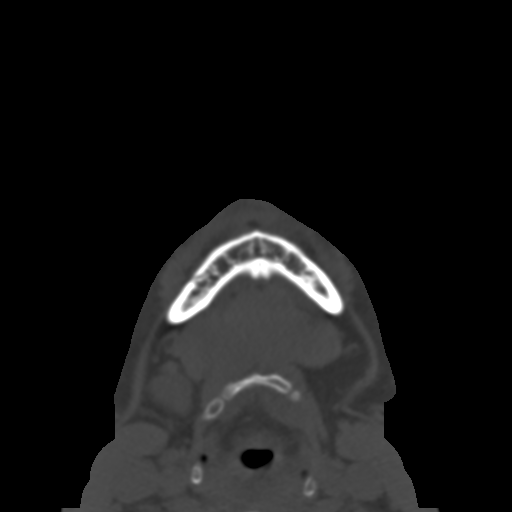
[im 23/83  bone]
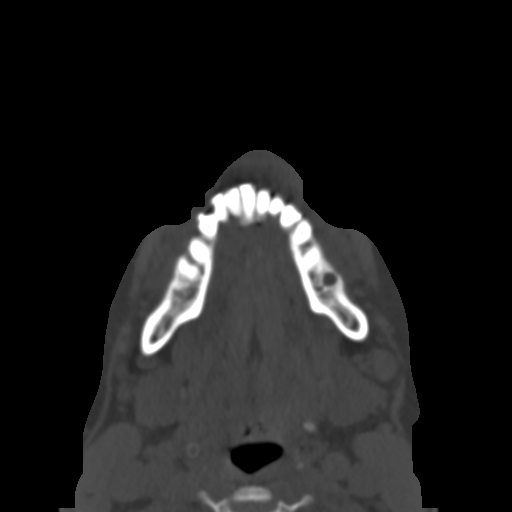
[im 29/83  bone]
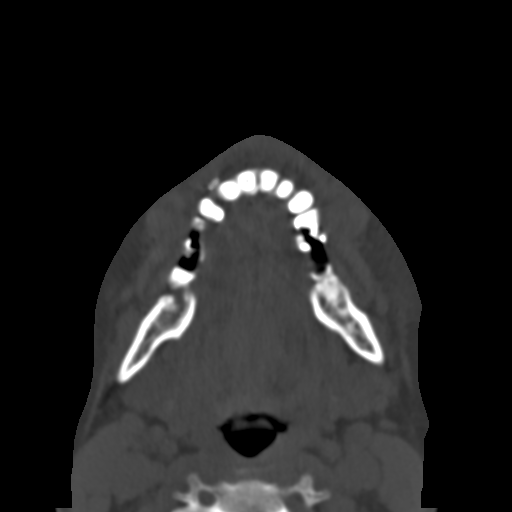
[im 37/83  brain]
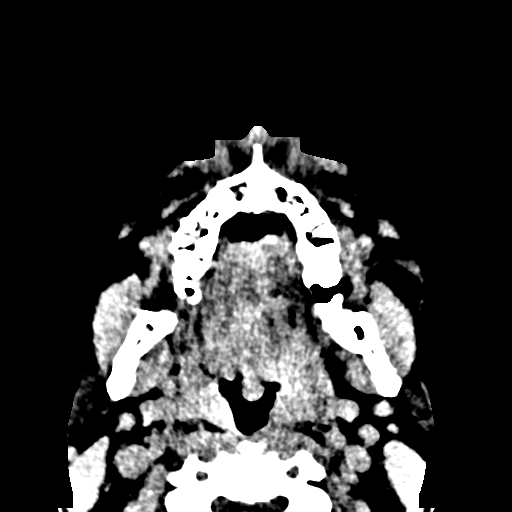
[im 37/83  bone]
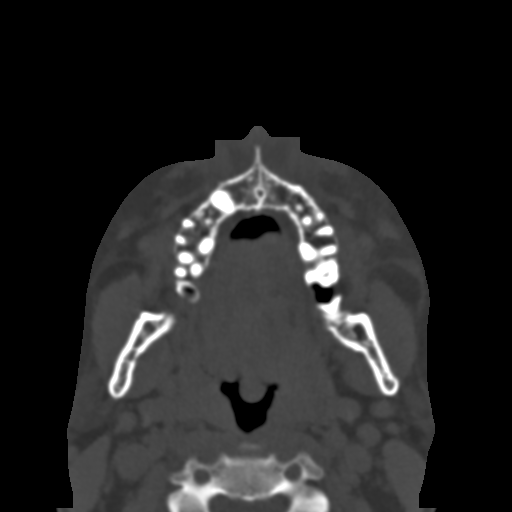
[im 46/83  bone]
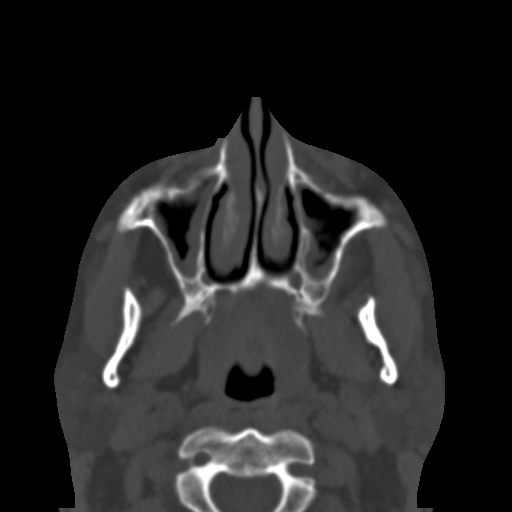
[im 54/83  bone]
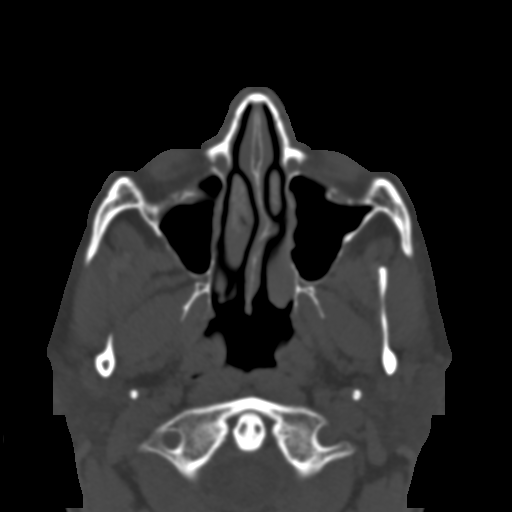
[im 63/83  bone]
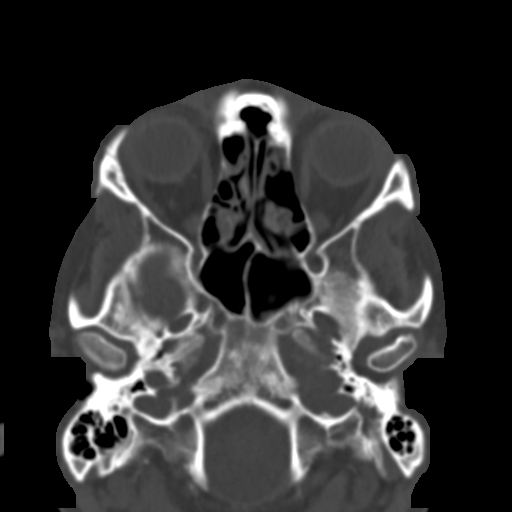
[im 68/83  brain]
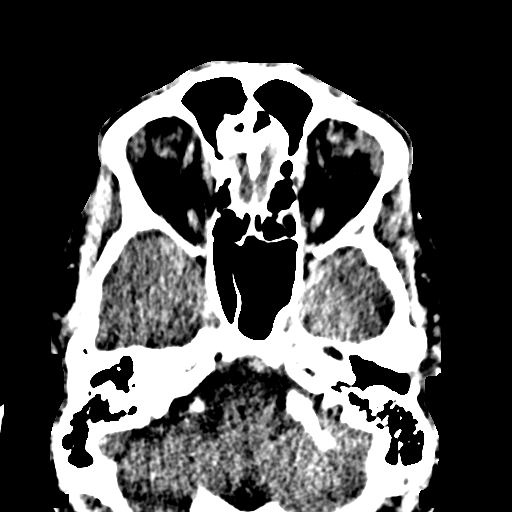
[im 68/83  bone]
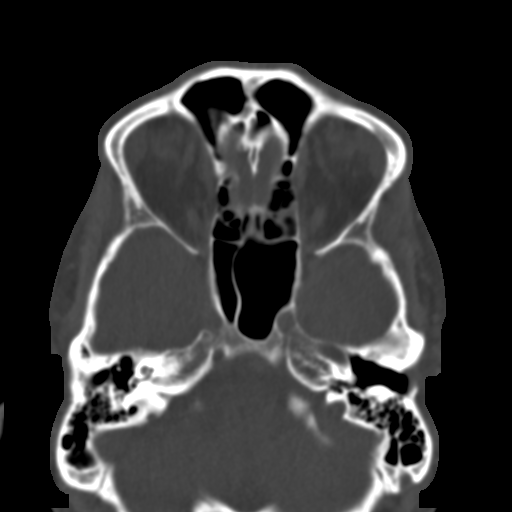
[im 77/83  bone]
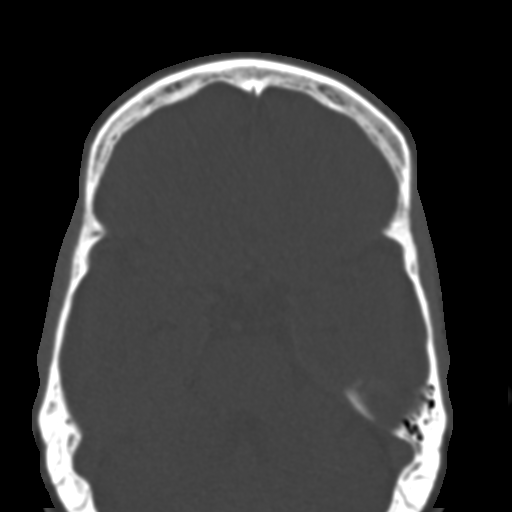

[Series 6: coronal soft · coronal · 0.37mm/px · 3 of 85 slices shown]
[im 29/85  bone]
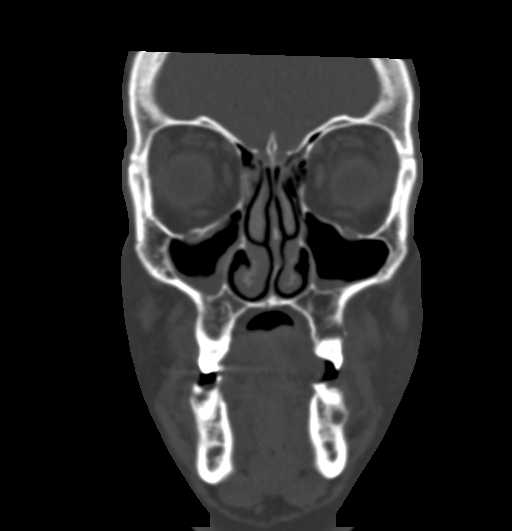
[im 38/85  bone]
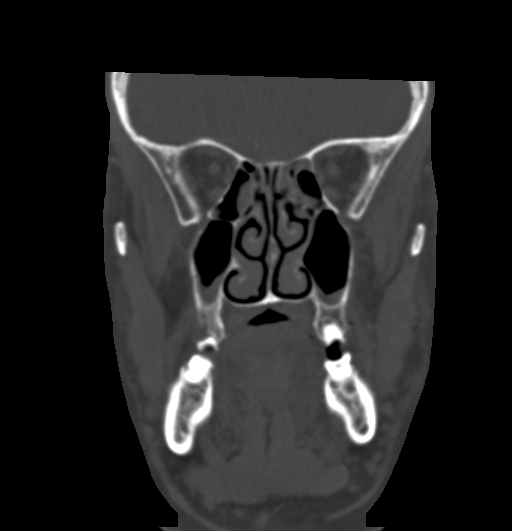
[im 47/85  bone]
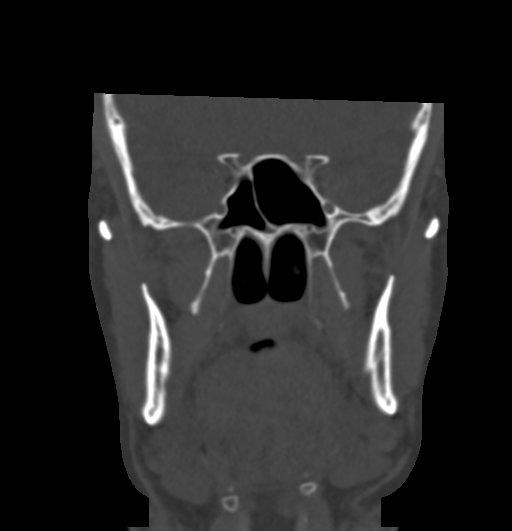

[Series 7: sagittal soft · sagittal · 0.39mm/px · 3 of 83 slices shown]
[im 28/83  bone]
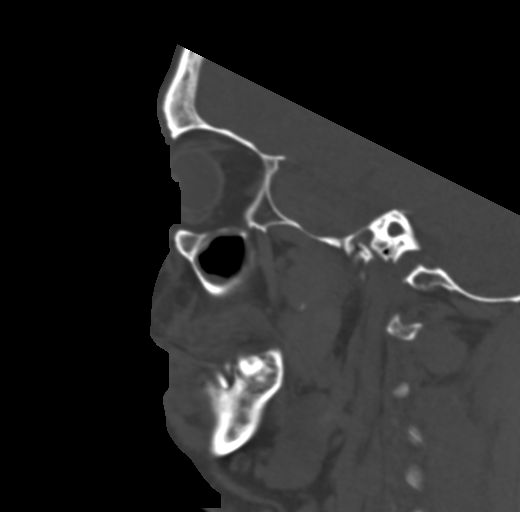
[im 42/83  bone]
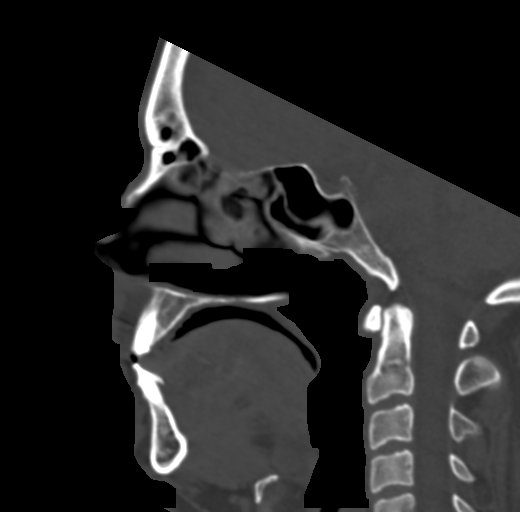
[im 55/83  bone]
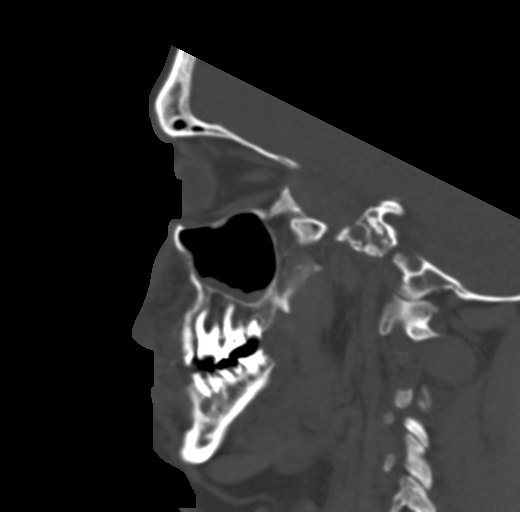

[16 of 47 positions shown; findings below may reference images not displayed]

FINDINGS: CT HEAD FINDINGS

Brain: No evidence of acute infarction, hemorrhage, hydrocephalus,
extra-axial collection or mass lesion/mass effect.

Vascular: No hyperdense vessel or unexpected calcification.

Skull: No scalp swelling or hematoma. No calvarial fracture.

Other: None.

CT MAXILLOFACIAL FINDINGS

Osseous: No fracture of the bony orbits. Nasal bones are intact. No
mid face fractures are seen. The pterygoid plates are intact. The
mandible is intact. Temporomandibular joints are normally aligned.
No temporal bone fractures are identified. No fractured or avulsed
teeth. Extensive periodontal disease with numerous carious lesions
and periapical lucencies. Partially erupted right maxillary cuspid
with orthodontic hardware.

Orbits: The globes appear normal and symmetric. Symmetric appearance
of the extraocular musculature and optic nerve sheath complexes.
Normal caliber of the superior ophthalmic veins.

Sinuses: Mild pansinus mural disease. No air-fluid levels. Mastoid
air cells are well aerated. Middle ear cavities are clear. Debris
within both external auditory canals. Ossicular chains are normal
configuration.

Soft tissues: Mild asymmetric left infraorbital and malar swelling
without subcutaneous gas or hematoma. No radiopaque foreign bodies.
IMPRESSION: 1. No acute intracranial abnormality.
2. Mild asymmetric left infraorbital and malar swelling without
subcutaneous gas or hematoma. No acute facial or calvarial fracture.
3. Extensive periodontal disease.

## 2020-07-07 IMAGING — CT CT HEAD W/O CM
3 series · 15 of 47 positions shown, 18 images · non-contrast
Comparison: None.

CLINICAL DATA: Facial trauma, fracture suspected. Struck by hood of
car while working on vehicle. Left-sided facial pain.

EXAM:
CT HEAD WITHOUT CONTRAST
CT MAXILLOFACIAL WITHOUT CONTRAST
TECHNIQUE: Multidetector CT imaging of the head and maxillofacial structures
were performed using the standard protocol without intravenous
contrast. Multiplanar CT image reconstructions of the maxillofacial
structures were also generated.

[Series 2: head wo · axial · 0.43mm/px · z∈[-103,+22]mm · 9 of 31 slices shown, 12 images]
[im 3/31  brain]
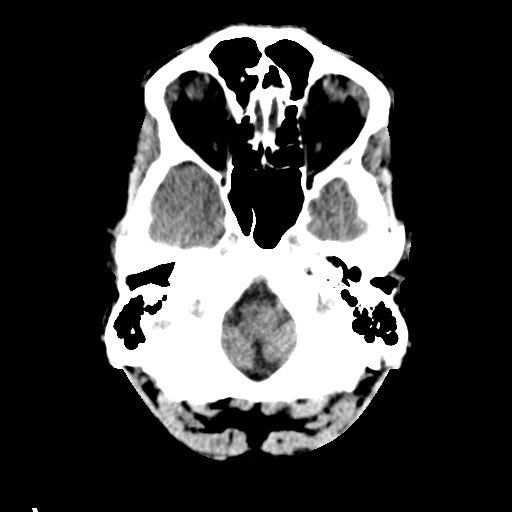
[im 3/31  bone]
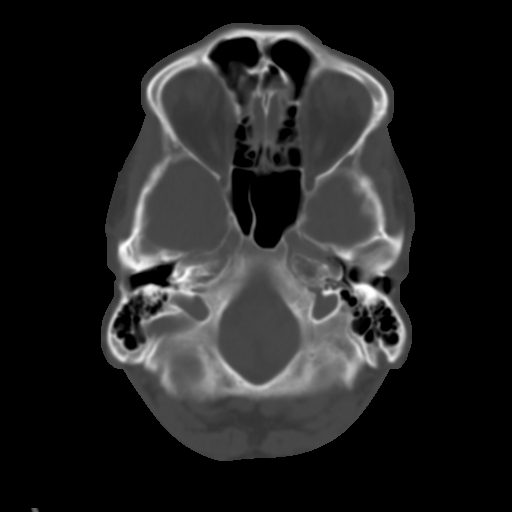
[im 6/31  brain]
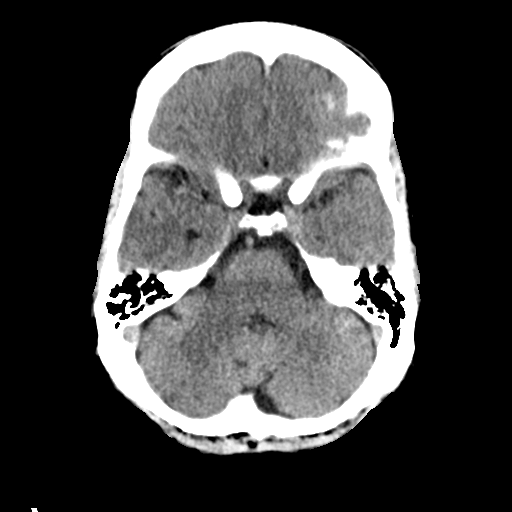
[im 9/31  brain]
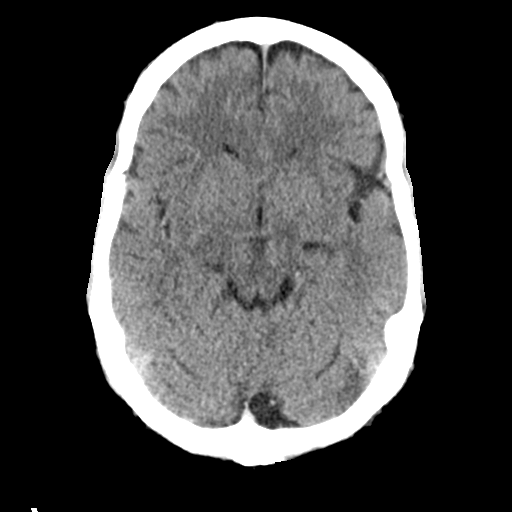
[im 12/31  brain]
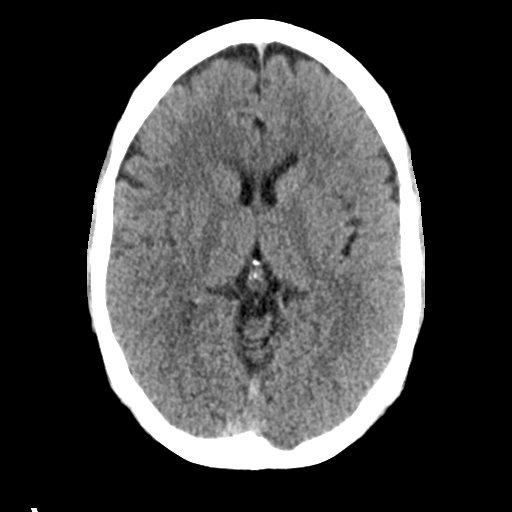
[im 16/31  brain]
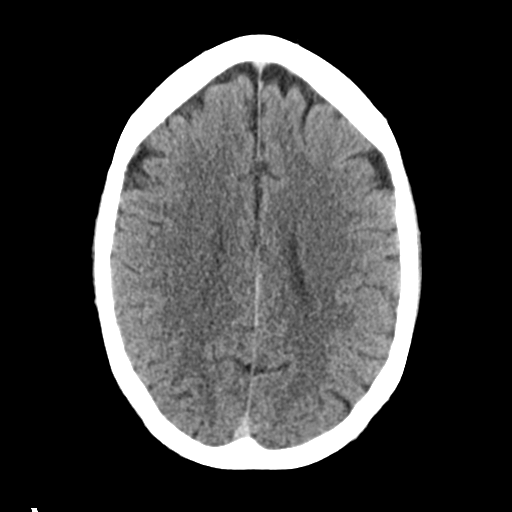
[im 16/31  bone]
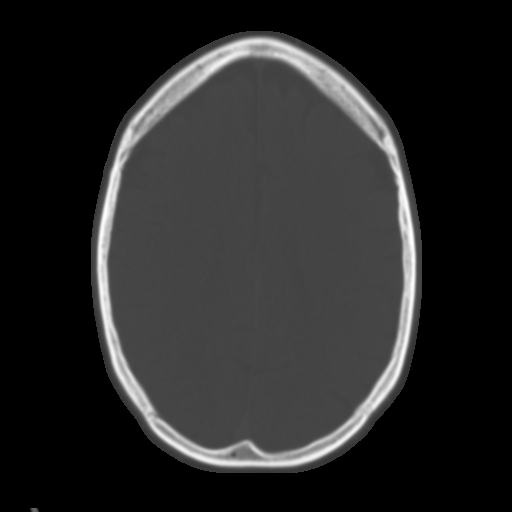
[im 19/31  brain]
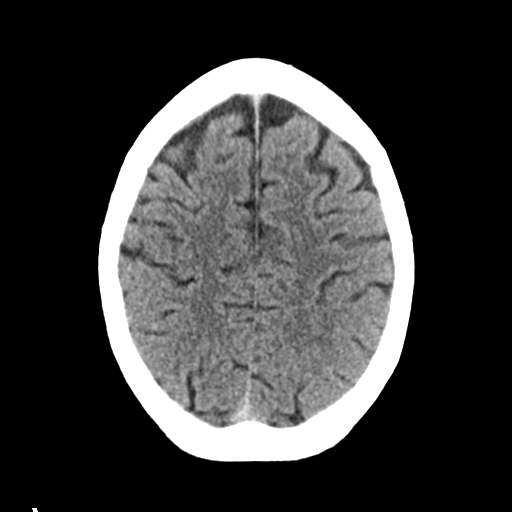
[im 22/31  brain]
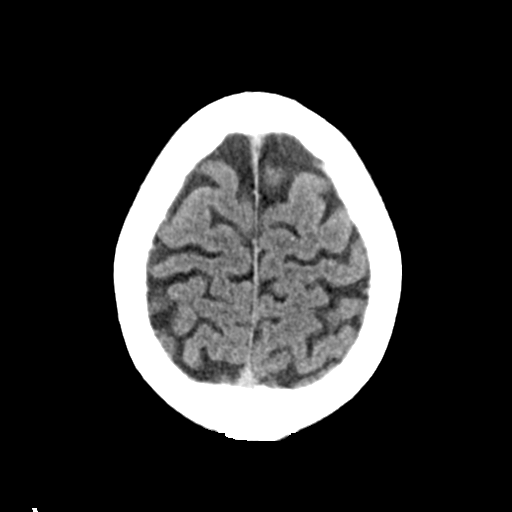
[im 25/31  brain]
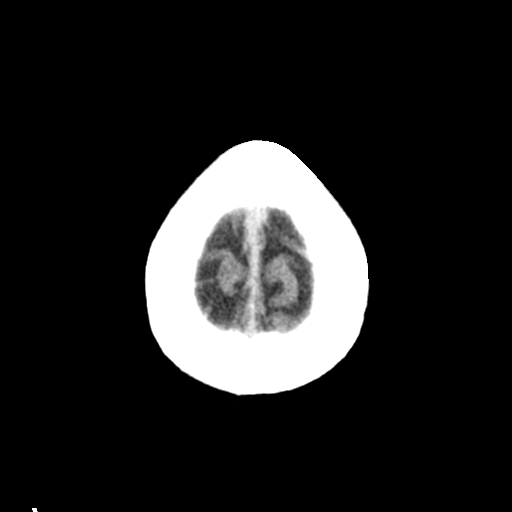
[im 28/31  brain]
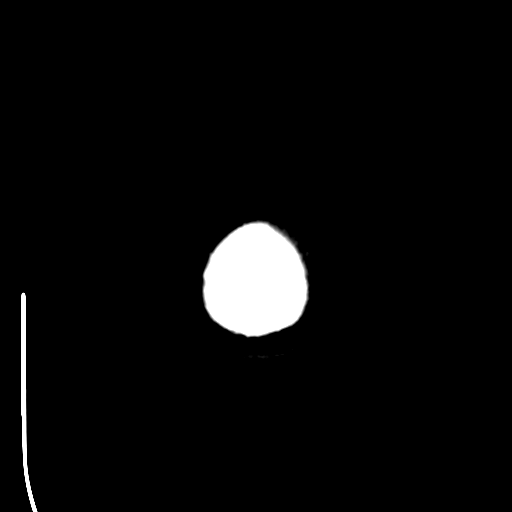
[im 28/31  bone]
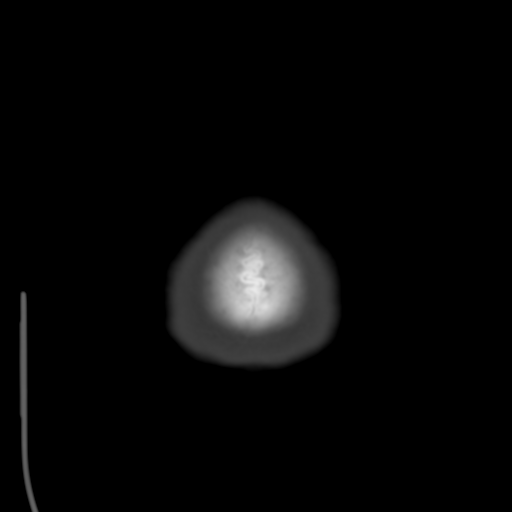

[Series 4: coronal soft tissue · coronal · 0.31mm/px · 3 of 68 slices shown]
[im 23/68  brain]
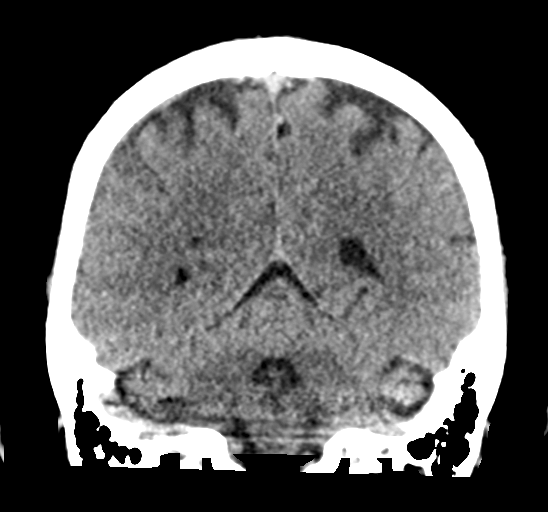
[im 30/68  brain]
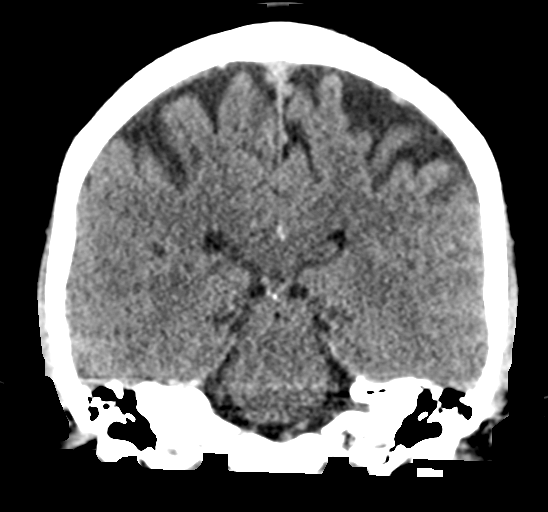
[im 38/68  brain]
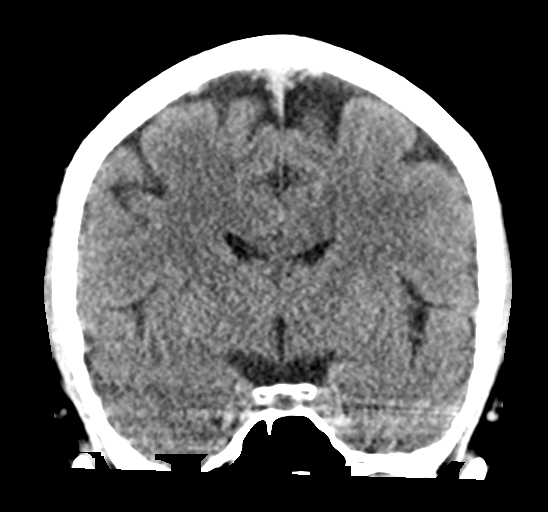

[Series 5: sagittal soft tissue · sagittal · 0.32mm/px · 3 of 52 slices shown]
[im 18/52  brain]
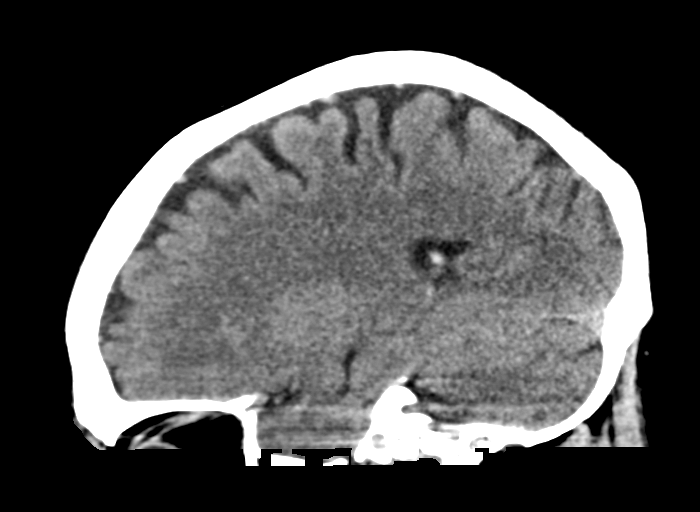
[im 26/52  brain]
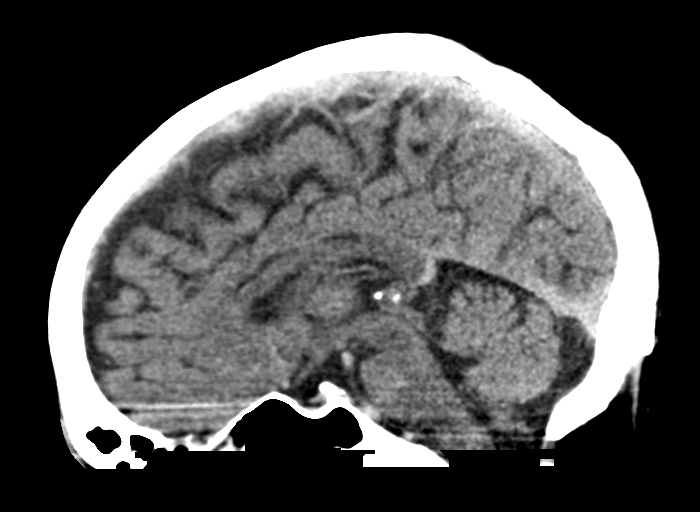
[im 35/52  brain]
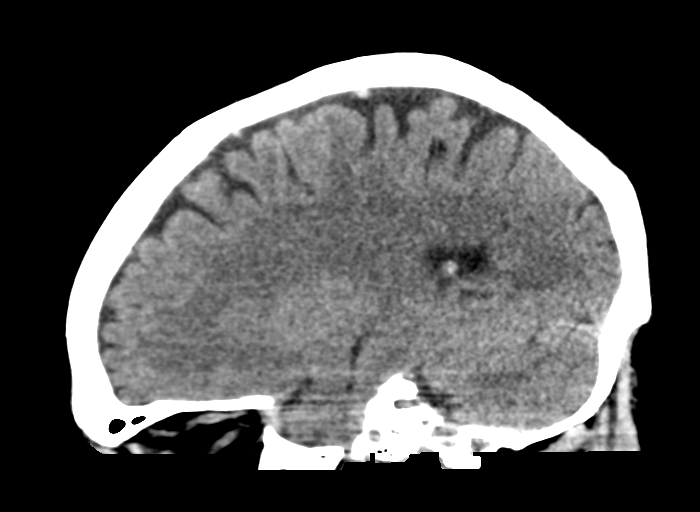

[15 of 47 positions shown; findings below may reference images not displayed]

FINDINGS: CT HEAD FINDINGS

Brain: No evidence of acute infarction, hemorrhage, hydrocephalus,
extra-axial collection or mass lesion/mass effect.

Vascular: No hyperdense vessel or unexpected calcification.

Skull: No scalp swelling or hematoma. No calvarial fracture.

Other: None.

CT MAXILLOFACIAL FINDINGS

Osseous: No fracture of the bony orbits. Nasal bones are intact. No
mid face fractures are seen. The pterygoid plates are intact. The
mandible is intact. Temporomandibular joints are normally aligned.
No temporal bone fractures are identified. No fractured or avulsed
teeth. Extensive periodontal disease with numerous carious lesions
and periapical lucencies. Partially erupted right maxillary cuspid
with orthodontic hardware.

Orbits: The globes appear normal and symmetric. Symmetric appearance
of the extraocular musculature and optic nerve sheath complexes.
Normal caliber of the superior ophthalmic veins.

Sinuses: Mild pansinus mural disease. No air-fluid levels. Mastoid
air cells are well aerated. Middle ear cavities are clear. Debris
within both external auditory canals. Ossicular chains are normal
configuration.

Soft tissues: Mild asymmetric left infraorbital and malar swelling
without subcutaneous gas or hematoma. No radiopaque foreign bodies.
IMPRESSION: 1. No acute intracranial abnormality.
2. Mild asymmetric left infraorbital and malar swelling without
subcutaneous gas or hematoma. No acute facial or calvarial fracture.
3. Extensive periodontal disease.
